# Patient Record
Sex: Male | Born: 1970
Health system: Southern US, Community
[De-identification: ages and names within clinical notes are randomized; demographics above are authoritative.]

---

## 2000-09-29 ENCOUNTER — Encounter: Admission: RE | Admit: 2000-09-29 | Discharge: 2000-09-29 | Payer: Self-pay | Admitting: Family Medicine

## 2000-10-08 ENCOUNTER — Encounter: Admission: RE | Admit: 2000-10-08 | Discharge: 2000-10-08 | Payer: Self-pay | Admitting: Family Medicine

## 2000-10-11 ENCOUNTER — Encounter: Admission: RE | Admit: 2000-10-11 | Discharge: 2001-01-09 | Payer: Self-pay | Admitting: *Deleted

## 2000-10-27 ENCOUNTER — Encounter: Admission: RE | Admit: 2000-10-27 | Discharge: 2000-10-27 | Payer: Self-pay | Admitting: Family Medicine

## 2001-03-10 ENCOUNTER — Encounter: Admission: RE | Admit: 2001-03-10 | Discharge: 2001-03-10 | Payer: Self-pay | Admitting: Family Medicine

## 2001-03-31 ENCOUNTER — Encounter: Admission: RE | Admit: 2001-03-31 | Discharge: 2001-03-31 | Payer: Self-pay | Admitting: Family Medicine

## 2001-04-11 ENCOUNTER — Encounter: Admission: RE | Admit: 2001-04-11 | Discharge: 2001-04-11 | Payer: Self-pay | Admitting: Family Medicine

## 2001-06-03 ENCOUNTER — Encounter: Admission: RE | Admit: 2001-06-03 | Discharge: 2001-06-03 | Payer: Self-pay | Admitting: Family Medicine

## 2001-06-14 ENCOUNTER — Encounter: Admission: RE | Admit: 2001-06-14 | Discharge: 2001-06-14 | Payer: Self-pay | Admitting: Sports Medicine

## 2001-09-22 ENCOUNTER — Encounter: Admission: RE | Admit: 2001-09-22 | Discharge: 2001-09-22 | Payer: Self-pay | Admitting: Family Medicine

## 2001-10-18 ENCOUNTER — Encounter: Admission: RE | Admit: 2001-10-18 | Discharge: 2001-10-18 | Payer: Self-pay | Admitting: Sports Medicine

## 2001-12-05 ENCOUNTER — Encounter: Admission: RE | Admit: 2001-12-05 | Discharge: 2001-12-05 | Payer: Self-pay | Admitting: Family Medicine

## 2001-12-07 ENCOUNTER — Encounter: Admission: RE | Admit: 2001-12-07 | Discharge: 2001-12-07 | Payer: Self-pay | Admitting: Sports Medicine

## 2001-12-07 ENCOUNTER — Encounter: Payer: Self-pay | Admitting: Sports Medicine

## 2001-12-26 ENCOUNTER — Encounter: Admission: RE | Admit: 2001-12-26 | Discharge: 2001-12-26 | Payer: Self-pay | Admitting: Family Medicine

## 2002-06-28 ENCOUNTER — Encounter: Admission: RE | Admit: 2002-06-28 | Discharge: 2002-06-28 | Payer: Self-pay | Admitting: Family Medicine

## 2002-12-01 ENCOUNTER — Encounter: Admission: RE | Admit: 2002-12-01 | Discharge: 2002-12-01 | Payer: Self-pay | Admitting: Family Medicine

## 2002-12-02 ENCOUNTER — Emergency Department (HOSPITAL_COMMUNITY): Admission: EM | Admit: 2002-12-02 | Discharge: 2002-12-02 | Payer: Self-pay | Admitting: Emergency Medicine

## 2002-12-07 ENCOUNTER — Encounter: Admission: RE | Admit: 2002-12-07 | Discharge: 2002-12-07 | Payer: Self-pay | Admitting: Family Medicine

## 2002-12-26 ENCOUNTER — Ambulatory Visit (HOSPITAL_COMMUNITY): Admission: RE | Admit: 2002-12-26 | Discharge: 2002-12-26 | Payer: Self-pay | Admitting: Sports Medicine

## 2003-01-02 ENCOUNTER — Encounter: Admission: RE | Admit: 2003-01-02 | Discharge: 2003-01-02 | Payer: Self-pay | Admitting: Family Medicine

## 2003-01-15 ENCOUNTER — Encounter: Payer: Self-pay | Admitting: Emergency Medicine

## 2003-01-15 ENCOUNTER — Emergency Department (HOSPITAL_COMMUNITY): Admission: EM | Admit: 2003-01-15 | Discharge: 2003-01-15 | Payer: Self-pay | Admitting: Emergency Medicine

## 2003-03-09 ENCOUNTER — Encounter: Admission: RE | Admit: 2003-03-09 | Discharge: 2003-03-09 | Payer: Self-pay | Admitting: Family Medicine

## 2003-03-12 ENCOUNTER — Encounter: Admission: RE | Admit: 2003-03-12 | Discharge: 2003-03-12 | Payer: Self-pay | Admitting: Family Medicine

## 2003-04-02 ENCOUNTER — Encounter: Admission: RE | Admit: 2003-04-02 | Discharge: 2003-04-02 | Payer: Self-pay | Admitting: Family Medicine

## 2003-07-20 ENCOUNTER — Encounter: Admission: RE | Admit: 2003-07-20 | Discharge: 2003-07-20 | Payer: Self-pay | Admitting: Family Medicine

## 2004-01-14 ENCOUNTER — Encounter: Admission: RE | Admit: 2004-01-14 | Discharge: 2004-01-14 | Payer: Self-pay | Admitting: Family Medicine

## 2004-04-08 ENCOUNTER — Ambulatory Visit: Payer: Self-pay | Admitting: Family Medicine

## 2006-07-22 DIAGNOSIS — R809 Proteinuria, unspecified: Secondary | ICD-10-CM | POA: Insufficient documentation

## 2006-07-22 DIAGNOSIS — J45909 Unspecified asthma, uncomplicated: Secondary | ICD-10-CM | POA: Insufficient documentation

## 2006-07-22 DIAGNOSIS — E119 Type 2 diabetes mellitus without complications: Secondary | ICD-10-CM | POA: Insufficient documentation

## 2006-07-22 DIAGNOSIS — R81 Glycosuria: Secondary | ICD-10-CM | POA: Insufficient documentation

## 2006-07-22 DIAGNOSIS — E669 Obesity, unspecified: Secondary | ICD-10-CM | POA: Insufficient documentation

## 2006-07-22 DIAGNOSIS — I10 Essential (primary) hypertension: Secondary | ICD-10-CM | POA: Insufficient documentation

## 2006-07-22 DIAGNOSIS — R000 Tachycardia, unspecified: Secondary | ICD-10-CM | POA: Insufficient documentation

## 2006-07-22 DIAGNOSIS — K219 Gastro-esophageal reflux disease without esophagitis: Secondary | ICD-10-CM | POA: Insufficient documentation

## 2006-08-05 ENCOUNTER — Emergency Department (HOSPITAL_COMMUNITY): Admission: EM | Admit: 2006-08-05 | Discharge: 2006-08-05 | Payer: Self-pay | Admitting: Emergency Medicine

## 2006-08-27 ENCOUNTER — Ambulatory Visit: Payer: Self-pay | Admitting: Psychiatry

## 2006-08-27 ENCOUNTER — Inpatient Hospital Stay (HOSPITAL_COMMUNITY): Admission: AD | Admit: 2006-08-27 | Discharge: 2006-09-03 | Payer: Self-pay | Admitting: Psychiatry

## 2009-08-06 ENCOUNTER — Emergency Department (HOSPITAL_COMMUNITY): Admission: EM | Admit: 2009-08-06 | Discharge: 2009-08-06 | Payer: Self-pay | Admitting: Emergency Medicine

## 2010-08-18 LAB — DIFFERENTIAL
Basophils Absolute: 0.1 10*3/uL (ref 0.0–0.1)
Basophils Relative: 1 % (ref 0–1)
Eosinophils Absolute: 0.2 10*3/uL (ref 0.0–0.7)
Eosinophils Relative: 2 % (ref 0–5)
Lymphocytes Relative: 29 % (ref 12–46)
Lymphs Abs: 2.9 10*3/uL (ref 0.7–4.0)
Monocytes Absolute: 0.6 10*3/uL (ref 0.1–1.0)
Monocytes Relative: 6 % (ref 3–12)
Neutro Abs: 6.3 10*3/uL (ref 1.7–7.7)
Neutrophils Relative %: 63 % (ref 43–77)

## 2010-08-18 LAB — CBC
HCT: 48.5 % (ref 39.0–52.0)
Hemoglobin: 16.1 g/dL (ref 13.0–17.0)
MCHC: 33.1 g/dL (ref 30.0–36.0)
MCV: 90.8 fL (ref 78.0–100.0)
Platelets: 279 10*3/uL (ref 150–400)
RBC: 5.34 MIL/uL (ref 4.22–5.81)
RDW: 12.6 % (ref 11.5–15.5)
WBC: 10.1 10*3/uL (ref 4.0–10.5)

## 2010-08-18 LAB — BASIC METABOLIC PANEL
BUN: 7 mg/dL (ref 6–23)
CO2: 21 mEq/L (ref 19–32)
Calcium: 9.6 mg/dL (ref 8.4–10.5)
Chloride: 104 mEq/L (ref 96–112)
Creatinine, Ser: 0.79 mg/dL (ref 0.4–1.5)
GFR calc Af Amer: >60 mL/min (ref >60)
GFR calc non Af Amer: >60 mL/min (ref >60)
Glucose, Bld: 192 mg/dL — ABNORMAL HIGH (ref 70–99)
Potassium: 4.6 mEq/L (ref 3.5–5.1)
Sodium: 135 mEq/L (ref 135–145)

## 2010-08-18 LAB — URINALYSIS, ROUTINE W REFLEX MICROSCOPIC
Glucose, UA: NEGATIVE mg/dL
Hgb urine dipstick: NEGATIVE
Ketones, ur: NEGATIVE mg/dL
Protein, ur: NEGATIVE mg/dL

## 2010-08-18 LAB — POCT CARDIAC MARKERS: Troponin i, poc: <0.05 ng/mL (ref 0.00–0.09)
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
## 2015-12-18 DIAGNOSIS — Z0000 Encounter for general adult medical examination without abnormal findings: Secondary | ICD-10-CM | POA: Diagnosis not present

## 2015-12-18 DIAGNOSIS — E1165 Type 2 diabetes mellitus with hyperglycemia: Secondary | ICD-10-CM | POA: Diagnosis not present

## 2015-12-18 DIAGNOSIS — Z6841 Body Mass Index (BMI) 40.0 and over, adult: Secondary | ICD-10-CM | POA: Diagnosis not present

## 2015-12-18 DIAGNOSIS — I10 Essential (primary) hypertension: Secondary | ICD-10-CM | POA: Diagnosis not present

## 2015-12-18 DIAGNOSIS — E781 Pure hyperglyceridemia: Secondary | ICD-10-CM | POA: Diagnosis not present

## 2016-03-20 DIAGNOSIS — I10 Essential (primary) hypertension: Secondary | ICD-10-CM | POA: Diagnosis not present

## 2016-03-20 DIAGNOSIS — E781 Pure hyperglyceridemia: Secondary | ICD-10-CM | POA: Diagnosis not present

## 2016-03-20 DIAGNOSIS — E1165 Type 2 diabetes mellitus with hyperglycemia: Secondary | ICD-10-CM | POA: Diagnosis not present

## 2016-03-20 DIAGNOSIS — Z6841 Body Mass Index (BMI) 40.0 and over, adult: Secondary | ICD-10-CM | POA: Diagnosis not present

## 2016-06-18 DIAGNOSIS — E781 Pure hyperglyceridemia: Secondary | ICD-10-CM | POA: Diagnosis not present

## 2016-06-18 DIAGNOSIS — E1165 Type 2 diabetes mellitus with hyperglycemia: Secondary | ICD-10-CM | POA: Diagnosis not present

## 2016-06-22 DIAGNOSIS — Z6841 Body Mass Index (BMI) 40.0 and over, adult: Secondary | ICD-10-CM | POA: Diagnosis not present

## 2016-06-22 DIAGNOSIS — I10 Essential (primary) hypertension: Secondary | ICD-10-CM | POA: Diagnosis not present

## 2016-06-22 DIAGNOSIS — E781 Pure hyperglyceridemia: Secondary | ICD-10-CM | POA: Diagnosis not present

## 2016-06-22 DIAGNOSIS — E1165 Type 2 diabetes mellitus with hyperglycemia: Secondary | ICD-10-CM | POA: Diagnosis not present

## 2017-01-05 DIAGNOSIS — I10 Essential (primary) hypertension: Secondary | ICD-10-CM | POA: Diagnosis not present

## 2017-01-05 DIAGNOSIS — E781 Pure hyperglyceridemia: Secondary | ICD-10-CM | POA: Diagnosis not present

## 2017-01-05 DIAGNOSIS — E1165 Type 2 diabetes mellitus with hyperglycemia: Secondary | ICD-10-CM | POA: Diagnosis not present

## 2017-01-05 DIAGNOSIS — B372 Candidiasis of skin and nail: Secondary | ICD-10-CM | POA: Diagnosis not present

## 2017-02-08 DIAGNOSIS — E119 Type 2 diabetes mellitus without complications: Secondary | ICD-10-CM | POA: Diagnosis not present

## 2017-02-08 DIAGNOSIS — I10 Essential (primary) hypertension: Secondary | ICD-10-CM | POA: Diagnosis not present

## 2017-03-31 DIAGNOSIS — R0602 Shortness of breath: Secondary | ICD-10-CM | POA: Diagnosis not present

## 2017-03-31 DIAGNOSIS — I10 Essential (primary) hypertension: Secondary | ICD-10-CM | POA: Diagnosis not present

## 2017-03-31 DIAGNOSIS — E119 Type 2 diabetes mellitus without complications: Secondary | ICD-10-CM | POA: Diagnosis not present

## 2017-03-31 DIAGNOSIS — Z713 Dietary counseling and surveillance: Secondary | ICD-10-CM | POA: Diagnosis not present

## 2017-04-06 DIAGNOSIS — E781 Pure hyperglyceridemia: Secondary | ICD-10-CM | POA: Diagnosis not present

## 2017-04-06 DIAGNOSIS — E1165 Type 2 diabetes mellitus with hyperglycemia: Secondary | ICD-10-CM | POA: Diagnosis not present

## 2017-04-09 DIAGNOSIS — Z1331 Encounter for screening for depression: Secondary | ICD-10-CM | POA: Diagnosis not present

## 2017-04-09 DIAGNOSIS — E781 Pure hyperglyceridemia: Secondary | ICD-10-CM | POA: Diagnosis not present

## 2017-04-09 DIAGNOSIS — I10 Essential (primary) hypertension: Secondary | ICD-10-CM | POA: Diagnosis not present

## 2017-04-09 DIAGNOSIS — E1165 Type 2 diabetes mellitus with hyperglycemia: Secondary | ICD-10-CM | POA: Diagnosis not present

## 2017-05-17 DIAGNOSIS — J101 Influenza due to other identified influenza virus with other respiratory manifestations: Secondary | ICD-10-CM | POA: Diagnosis not present

## 2017-05-17 DIAGNOSIS — Z6841 Body Mass Index (BMI) 40.0 and over, adult: Secondary | ICD-10-CM | POA: Diagnosis not present

## 2017-05-17 DIAGNOSIS — J09X1 Influenza due to identified novel influenza A virus with pneumonia: Secondary | ICD-10-CM | POA: Diagnosis not present

## 2017-06-02 DIAGNOSIS — J4521 Mild intermittent asthma with (acute) exacerbation: Secondary | ICD-10-CM | POA: Diagnosis not present

## 2017-06-02 DIAGNOSIS — Z6841 Body Mass Index (BMI) 40.0 and over, adult: Secondary | ICD-10-CM | POA: Diagnosis not present

## 2017-06-02 DIAGNOSIS — I10 Essential (primary) hypertension: Secondary | ICD-10-CM | POA: Diagnosis not present

## 2017-07-12 DIAGNOSIS — E781 Pure hyperglyceridemia: Secondary | ICD-10-CM | POA: Diagnosis not present

## 2017-07-12 DIAGNOSIS — E1165 Type 2 diabetes mellitus with hyperglycemia: Secondary | ICD-10-CM | POA: Diagnosis not present

## 2017-07-14 DIAGNOSIS — Z6841 Body Mass Index (BMI) 40.0 and over, adult: Secondary | ICD-10-CM | POA: Diagnosis not present

## 2017-07-14 DIAGNOSIS — E781 Pure hyperglyceridemia: Secondary | ICD-10-CM | POA: Diagnosis not present

## 2017-07-14 DIAGNOSIS — I10 Essential (primary) hypertension: Secondary | ICD-10-CM | POA: Diagnosis not present

## 2017-07-14 DIAGNOSIS — E1165 Type 2 diabetes mellitus with hyperglycemia: Secondary | ICD-10-CM | POA: Diagnosis not present

## 2017-09-15 DIAGNOSIS — E1165 Type 2 diabetes mellitus with hyperglycemia: Secondary | ICD-10-CM | POA: Diagnosis not present

## 2017-09-15 DIAGNOSIS — I10 Essential (primary) hypertension: Secondary | ICD-10-CM | POA: Diagnosis not present

## 2017-09-15 DIAGNOSIS — E781 Pure hyperglyceridemia: Secondary | ICD-10-CM | POA: Diagnosis not present

## 2017-09-15 DIAGNOSIS — E1140 Type 2 diabetes mellitus with diabetic neuropathy, unspecified: Secondary | ICD-10-CM | POA: Diagnosis not present

## 2017-09-16 DIAGNOSIS — G4733 Obstructive sleep apnea (adult) (pediatric): Secondary | ICD-10-CM | POA: Diagnosis not present

## 2017-11-18 DIAGNOSIS — E1165 Type 2 diabetes mellitus with hyperglycemia: Secondary | ICD-10-CM | POA: Diagnosis not present

## 2017-11-18 DIAGNOSIS — E781 Pure hyperglyceridemia: Secondary | ICD-10-CM | POA: Diagnosis not present

## 2017-11-18 DIAGNOSIS — I10 Essential (primary) hypertension: Secondary | ICD-10-CM | POA: Diagnosis not present

## 2017-11-18 DIAGNOSIS — E1140 Type 2 diabetes mellitus with diabetic neuropathy, unspecified: Secondary | ICD-10-CM | POA: Diagnosis not present

## 2017-12-08 DIAGNOSIS — Z6841 Body Mass Index (BMI) 40.0 and over, adult: Secondary | ICD-10-CM | POA: Diagnosis not present

## 2017-12-08 DIAGNOSIS — I10 Essential (primary) hypertension: Secondary | ICD-10-CM | POA: Diagnosis not present

## 2017-12-08 DIAGNOSIS — E119 Type 2 diabetes mellitus without complications: Secondary | ICD-10-CM | POA: Diagnosis not present

## 2017-12-30 DIAGNOSIS — Z79899 Other long term (current) drug therapy: Secondary | ICD-10-CM | POA: Diagnosis not present

## 2017-12-30 DIAGNOSIS — K219 Gastro-esophageal reflux disease without esophagitis: Secondary | ICD-10-CM | POA: Diagnosis not present

## 2017-12-30 DIAGNOSIS — K9289 Other specified diseases of the digestive system: Secondary | ICD-10-CM | POA: Diagnosis not present

## 2017-12-30 DIAGNOSIS — I10 Essential (primary) hypertension: Secondary | ICD-10-CM | POA: Diagnosis not present

## 2017-12-30 DIAGNOSIS — Z6841 Body Mass Index (BMI) 40.0 and over, adult: Secondary | ICD-10-CM | POA: Diagnosis not present

## 2017-12-30 DIAGNOSIS — E119 Type 2 diabetes mellitus without complications: Secondary | ICD-10-CM | POA: Diagnosis not present

## 2017-12-30 DIAGNOSIS — Z01818 Encounter for other preprocedural examination: Secondary | ICD-10-CM | POA: Diagnosis not present

## 2017-12-30 DIAGNOSIS — L539 Erythematous condition, unspecified: Secondary | ICD-10-CM | POA: Diagnosis not present

## 2017-12-30 DIAGNOSIS — K2970 Gastritis, unspecified, without bleeding: Secondary | ICD-10-CM | POA: Diagnosis not present

## 2017-12-30 DIAGNOSIS — Z7984 Long term (current) use of oral hypoglycemic drugs: Secondary | ICD-10-CM | POA: Diagnosis not present

## 2018-01-05 DIAGNOSIS — I10 Essential (primary) hypertension: Secondary | ICD-10-CM | POA: Diagnosis not present

## 2018-01-05 DIAGNOSIS — E119 Type 2 diabetes mellitus without complications: Secondary | ICD-10-CM | POA: Diagnosis not present

## 2018-01-05 DIAGNOSIS — K219 Gastro-esophageal reflux disease without esophagitis: Secondary | ICD-10-CM | POA: Diagnosis not present

## 2018-01-05 DIAGNOSIS — Z6841 Body Mass Index (BMI) 40.0 and over, adult: Secondary | ICD-10-CM | POA: Diagnosis not present

## 2018-01-10 ENCOUNTER — Other Ambulatory Visit: Payer: Self-pay | Admitting: Specialist

## 2018-01-10 DIAGNOSIS — K9289 Other specified diseases of the digestive system: Secondary | ICD-10-CM

## 2018-01-15 ENCOUNTER — Encounter
Admission: RE | Admit: 2018-01-15 | Discharge: 2018-01-15 | Disposition: A | Discharge status: Home / Self Care | Payer: BLUE CROSS/BLUE SHIELD | Source: Ambulatory Visit | Attending: Specialist | Admitting: Specialist

## 2018-01-15 DIAGNOSIS — K9289 Other specified diseases of the digestive system: Secondary | ICD-10-CM | POA: Insufficient documentation

## 2018-01-15 DIAGNOSIS — R140 Abdominal distension (gaseous): Secondary | ICD-10-CM | POA: Diagnosis not present

## 2018-01-15 MED ORDER — TECHNETIUM TC 99M SULFUR COLLOID
2.0000 | Freq: Once | INTRAVENOUS | Status: AC | PRN
  Administered 2018-01-15: 2.33 via ORAL

## 2018-02-02 DIAGNOSIS — K219 Gastro-esophageal reflux disease without esophagitis: Secondary | ICD-10-CM | POA: Diagnosis not present

## 2018-02-02 DIAGNOSIS — K824 Cholesterolosis of gallbladder: Secondary | ICD-10-CM | POA: Diagnosis not present

## 2018-02-02 DIAGNOSIS — I10 Essential (primary) hypertension: Secondary | ICD-10-CM | POA: Diagnosis not present

## 2018-02-10 DIAGNOSIS — Z01818 Encounter for other preprocedural examination: Secondary | ICD-10-CM | POA: Diagnosis not present

## 2018-02-24 DIAGNOSIS — E559 Vitamin D deficiency, unspecified: Secondary | ICD-10-CM | POA: Diagnosis not present

## 2018-02-24 DIAGNOSIS — E119 Type 2 diabetes mellitus without complications: Secondary | ICD-10-CM | POA: Diagnosis not present

## 2018-02-24 DIAGNOSIS — J45909 Unspecified asthma, uncomplicated: Secondary | ICD-10-CM | POA: Diagnosis not present

## 2018-02-24 DIAGNOSIS — K824 Cholesterolosis of gallbladder: Secondary | ICD-10-CM | POA: Diagnosis not present

## 2018-02-24 DIAGNOSIS — K219 Gastro-esophageal reflux disease without esophagitis: Secondary | ICD-10-CM | POA: Diagnosis not present

## 2018-02-24 DIAGNOSIS — Z79899 Other long term (current) drug therapy: Secondary | ICD-10-CM | POA: Diagnosis not present

## 2018-02-24 DIAGNOSIS — E1140 Type 2 diabetes mellitus with diabetic neuropathy, unspecified: Secondary | ICD-10-CM | POA: Diagnosis not present

## 2018-02-24 DIAGNOSIS — K811 Chronic cholecystitis: Secondary | ICD-10-CM | POA: Diagnosis not present

## 2018-02-24 DIAGNOSIS — Z6841 Body Mass Index (BMI) 40.0 and over, adult: Secondary | ICD-10-CM | POA: Diagnosis not present

## 2018-02-24 DIAGNOSIS — Z7984 Long term (current) use of oral hypoglycemic drugs: Secondary | ICD-10-CM | POA: Diagnosis not present

## 2018-02-24 DIAGNOSIS — G4733 Obstructive sleep apnea (adult) (pediatric): Secondary | ICD-10-CM | POA: Diagnosis not present

## 2018-02-24 DIAGNOSIS — I10 Essential (primary) hypertension: Secondary | ICD-10-CM | POA: Diagnosis not present

## 2018-02-24 DIAGNOSIS — Z6839 Body mass index (BMI) 39.0-39.9, adult: Secondary | ICD-10-CM | POA: Diagnosis not present

## 2018-03-01 DIAGNOSIS — E11319 Type 2 diabetes mellitus with unspecified diabetic retinopathy without macular edema: Secondary | ICD-10-CM | POA: Diagnosis not present

## 2018-03-15 DIAGNOSIS — Z713 Dietary counseling and surveillance: Secondary | ICD-10-CM | POA: Diagnosis not present

## 2018-03-15 DIAGNOSIS — Z9884 Bariatric surgery status: Secondary | ICD-10-CM | POA: Diagnosis not present

## 2018-06-02 DIAGNOSIS — E1140 Type 2 diabetes mellitus with diabetic neuropathy, unspecified: Secondary | ICD-10-CM | POA: Diagnosis not present

## 2018-06-02 DIAGNOSIS — E1165 Type 2 diabetes mellitus with hyperglycemia: Secondary | ICD-10-CM | POA: Diagnosis not present

## 2018-06-02 DIAGNOSIS — E781 Pure hyperglyceridemia: Secondary | ICD-10-CM | POA: Diagnosis not present

## 2018-06-02 DIAGNOSIS — I10 Essential (primary) hypertension: Secondary | ICD-10-CM | POA: Diagnosis not present

## 2018-12-01 DIAGNOSIS — Z9884 Bariatric surgery status: Secondary | ICD-10-CM | POA: Diagnosis not present

## 2018-12-01 DIAGNOSIS — E1140 Type 2 diabetes mellitus with diabetic neuropathy, unspecified: Secondary | ICD-10-CM | POA: Diagnosis not present

## 2018-12-01 DIAGNOSIS — Z6834 Body mass index (BMI) 34.0-34.9, adult: Secondary | ICD-10-CM | POA: Diagnosis not present

## 2018-12-01 DIAGNOSIS — E781 Pure hyperglyceridemia: Secondary | ICD-10-CM | POA: Diagnosis not present

## 2018-12-01 DIAGNOSIS — Z1331 Encounter for screening for depression: Secondary | ICD-10-CM | POA: Diagnosis not present

## 2019-01-17 DIAGNOSIS — Z6830 Body mass index (BMI) 30.0-30.9, adult: Secondary | ICD-10-CM | POA: Diagnosis not present

## 2019-01-17 DIAGNOSIS — M545 Low back pain: Secondary | ICD-10-CM | POA: Diagnosis not present

## 2019-04-05 DIAGNOSIS — E669 Obesity, unspecified: Secondary | ICD-10-CM | POA: Diagnosis not present

## 2019-04-05 DIAGNOSIS — M545 Low back pain: Secondary | ICD-10-CM | POA: Diagnosis not present

## 2019-04-05 DIAGNOSIS — Z6830 Body mass index (BMI) 30.0-30.9, adult: Secondary | ICD-10-CM | POA: Diagnosis not present

## 2019-05-03 DIAGNOSIS — Q7649 Other congenital malformations of spine, not associated with scoliosis: Secondary | ICD-10-CM | POA: Diagnosis not present

## 2019-05-03 DIAGNOSIS — M545 Low back pain: Secondary | ICD-10-CM | POA: Diagnosis not present

## 2019-05-03 DIAGNOSIS — M9904 Segmental and somatic dysfunction of sacral region: Secondary | ICD-10-CM | POA: Diagnosis not present

## 2019-05-03 DIAGNOSIS — M9903 Segmental and somatic dysfunction of lumbar region: Secondary | ICD-10-CM | POA: Diagnosis not present

## 2019-05-10 DIAGNOSIS — M545 Low back pain: Secondary | ICD-10-CM | POA: Diagnosis not present

## 2019-05-10 DIAGNOSIS — M9904 Segmental and somatic dysfunction of sacral region: Secondary | ICD-10-CM | POA: Diagnosis not present

## 2019-05-10 DIAGNOSIS — M9903 Segmental and somatic dysfunction of lumbar region: Secondary | ICD-10-CM | POA: Diagnosis not present

## 2019-05-10 DIAGNOSIS — Q7649 Other congenital malformations of spine, not associated with scoliosis: Secondary | ICD-10-CM | POA: Diagnosis not present

## 2019-05-15 DIAGNOSIS — M545 Low back pain: Secondary | ICD-10-CM | POA: Diagnosis not present

## 2019-05-15 DIAGNOSIS — M9903 Segmental and somatic dysfunction of lumbar region: Secondary | ICD-10-CM | POA: Diagnosis not present

## 2019-05-15 DIAGNOSIS — M9904 Segmental and somatic dysfunction of sacral region: Secondary | ICD-10-CM | POA: Diagnosis not present

## 2019-05-15 DIAGNOSIS — Q7649 Other congenital malformations of spine, not associated with scoliosis: Secondary | ICD-10-CM | POA: Diagnosis not present

## 2019-05-22 DIAGNOSIS — Z20828 Contact with and (suspected) exposure to other viral communicable diseases: Secondary | ICD-10-CM | POA: Diagnosis not present

## 2019-06-12 DIAGNOSIS — M9903 Segmental and somatic dysfunction of lumbar region: Secondary | ICD-10-CM | POA: Diagnosis not present

## 2019-06-12 DIAGNOSIS — M545 Low back pain: Secondary | ICD-10-CM | POA: Diagnosis not present

## 2019-06-12 DIAGNOSIS — M9904 Segmental and somatic dysfunction of sacral region: Secondary | ICD-10-CM | POA: Diagnosis not present

## 2019-06-12 DIAGNOSIS — Q7649 Other congenital malformations of spine, not associated with scoliosis: Secondary | ICD-10-CM | POA: Diagnosis not present

## 2019-06-19 DIAGNOSIS — Q7649 Other congenital malformations of spine, not associated with scoliosis: Secondary | ICD-10-CM | POA: Diagnosis not present

## 2019-06-19 DIAGNOSIS — M545 Low back pain: Secondary | ICD-10-CM | POA: Diagnosis not present

## 2019-06-19 DIAGNOSIS — M9904 Segmental and somatic dysfunction of sacral region: Secondary | ICD-10-CM | POA: Diagnosis not present

## 2019-06-19 DIAGNOSIS — M9903 Segmental and somatic dysfunction of lumbar region: Secondary | ICD-10-CM | POA: Diagnosis not present

## 2019-06-26 DIAGNOSIS — M9903 Segmental and somatic dysfunction of lumbar region: Secondary | ICD-10-CM | POA: Diagnosis not present

## 2019-06-26 DIAGNOSIS — Q7649 Other congenital malformations of spine, not associated with scoliosis: Secondary | ICD-10-CM | POA: Diagnosis not present

## 2019-06-26 DIAGNOSIS — M545 Low back pain: Secondary | ICD-10-CM | POA: Diagnosis not present

## 2019-06-26 DIAGNOSIS — M9904 Segmental and somatic dysfunction of sacral region: Secondary | ICD-10-CM | POA: Diagnosis not present

## 2019-07-07 DIAGNOSIS — M545 Low back pain: Secondary | ICD-10-CM | POA: Diagnosis not present

## 2019-07-07 DIAGNOSIS — E1140 Type 2 diabetes mellitus with diabetic neuropathy, unspecified: Secondary | ICD-10-CM | POA: Diagnosis not present

## 2019-07-20 DIAGNOSIS — Q7649 Other congenital malformations of spine, not associated with scoliosis: Secondary | ICD-10-CM | POA: Diagnosis not present

## 2019-07-20 DIAGNOSIS — M545 Low back pain: Secondary | ICD-10-CM | POA: Diagnosis not present

## 2019-07-20 DIAGNOSIS — M9904 Segmental and somatic dysfunction of sacral region: Secondary | ICD-10-CM | POA: Diagnosis not present

## 2019-07-20 DIAGNOSIS — M9903 Segmental and somatic dysfunction of lumbar region: Secondary | ICD-10-CM | POA: Diagnosis not present

## 2019-08-03 DIAGNOSIS — M9903 Segmental and somatic dysfunction of lumbar region: Secondary | ICD-10-CM | POA: Diagnosis not present

## 2019-08-03 DIAGNOSIS — M545 Low back pain: Secondary | ICD-10-CM | POA: Diagnosis not present

## 2019-08-03 DIAGNOSIS — M9904 Segmental and somatic dysfunction of sacral region: Secondary | ICD-10-CM | POA: Diagnosis not present

## 2019-08-03 DIAGNOSIS — Q7649 Other congenital malformations of spine, not associated with scoliosis: Secondary | ICD-10-CM | POA: Diagnosis not present

## 2019-12-16 ENCOUNTER — Emergency Department (HOSPITAL_COMMUNITY)
Admission: EM | Admit: 2019-12-16 | Discharge: 2019-12-16 | Disposition: A | Discharge status: Home / Self Care | Payer: BLUE CROSS/BLUE SHIELD

## 2019-12-16 DIAGNOSIS — R52 Pain, unspecified: Secondary | ICD-10-CM | POA: Diagnosis not present

## 2019-12-16 DIAGNOSIS — R1084 Generalized abdominal pain: Secondary | ICD-10-CM | POA: Diagnosis not present

## 2019-12-16 DIAGNOSIS — I10 Essential (primary) hypertension: Secondary | ICD-10-CM | POA: Diagnosis not present

## 2019-12-16 DIAGNOSIS — R000 Tachycardia, unspecified: Secondary | ICD-10-CM | POA: Diagnosis not present

## 2019-12-16 NOTE — ED Notes (Signed)
Patient left triage after arrival , stated" I'm leaving".

## 2019-12-27 DIAGNOSIS — M793 Panniculitis, unspecified: Secondary | ICD-10-CM | POA: Diagnosis not present

## 2019-12-27 DIAGNOSIS — Z9884 Bariatric surgery status: Secondary | ICD-10-CM | POA: Diagnosis not present

## 2020-02-07 DIAGNOSIS — M545 Low back pain: Secondary | ICD-10-CM | POA: Diagnosis not present

## 2020-02-07 DIAGNOSIS — E781 Pure hyperglyceridemia: Secondary | ICD-10-CM | POA: Diagnosis not present

## 2020-02-07 DIAGNOSIS — Z9884 Bariatric surgery status: Secondary | ICD-10-CM | POA: Diagnosis not present

## 2020-02-07 DIAGNOSIS — E1140 Type 2 diabetes mellitus with diabetic neuropathy, unspecified: Secondary | ICD-10-CM | POA: Diagnosis not present

## 2020-08-08 DIAGNOSIS — Z1331 Encounter for screening for depression: Secondary | ICD-10-CM | POA: Diagnosis not present

## 2020-08-08 DIAGNOSIS — Z9884 Bariatric surgery status: Secondary | ICD-10-CM | POA: Diagnosis not present

## 2020-08-08 DIAGNOSIS — E1140 Type 2 diabetes mellitus with diabetic neuropathy, unspecified: Secondary | ICD-10-CM | POA: Diagnosis not present

## 2020-08-08 DIAGNOSIS — E781 Pure hyperglyceridemia: Secondary | ICD-10-CM | POA: Diagnosis not present

## 2020-08-16 IMAGING — NM NM GASTRIC EMPTYING
6 series · 20 of 20 positions shown · non-contrast
Comparison: None.

CLINICAL DATA: Bloating/gas for 2 years.  Pain.

EXAM:
NUCLEAR MEDICINE GASTRIC EMPTYING SCAN
TECHNIQUE: After oral ingestion of radiolabeled meal, sequential abdominal
images were obtained for 4 hours. Percentage of activity emptying
the stomach was calculated at 1 hour, 2 hour, 3 hour, and 4 hours.
RADIOPHARMACEUTICALS:  2.33 mCi 6c-66m sulfur colloid in
standardized meal

[Series 1000: gastric statics · 3.90mm/px · 2 of 2 frames shown (1 of 3)]
[frame 1/2]
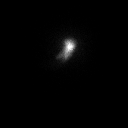
[frame 2/2]
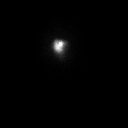

[Series 1000: gatric statics · 3.90mm/px · 2 of 2 frames shown (1 of 2)]
[frame 1/2]
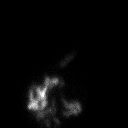
[frame 2/2]
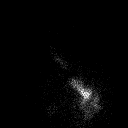

[Series 1000: gastric statics · 3.90mm/px · 2 of 2 frames shown (2 of 3)]
[frame 1/2]
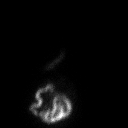
[frame 2/2]
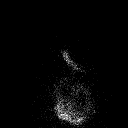

[Series 1000: gatric statics (results) · 3.90mm/px · 5 acquisitions, 10 frames shown]
[im 1/5]
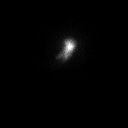
[im 1/5]
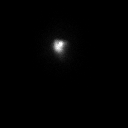
[im 2/5]
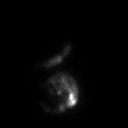
[im 2/5]
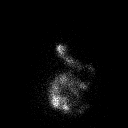
[im 3/5]
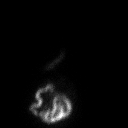
[im 3/5]
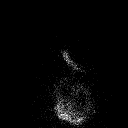
[im 4/5]
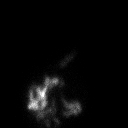
[im 4/5]
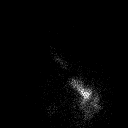
[im 5/5]
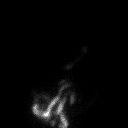
[im 5/5]
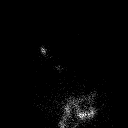

[Series 1000: gatric statics · 3.90mm/px · 2 of 2 frames shown (2 of 2)]
[frame 1/2]
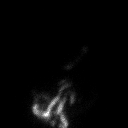
[frame 2/2]
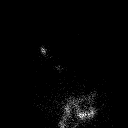

[Series 1000: gastric statics · 3.90mm/px · 2 of 2 frames shown (3 of 3)]
[frame 1/2]
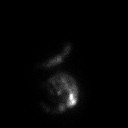
[frame 2/2]
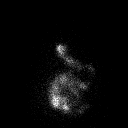

[20 of 20 positions shown; findings below may reference images not displayed]

FINDINGS: Expected location of the stomach in the left upper quadrant.
Ingested meal empties the stomach quickly with most of the emptying
occurring over the first hour and in gradual additional impeding
over the remaining 3 hours.

84% emptied at 1 hr ( normal >= 10%)

95% emptied at 2 hr ( normal >= 40%)

95% emptied at 3 hr ( normal >= 70%)

97% emptied at 4 hr ( normal >= 90%)
IMPRESSION: Normal gastric emptying study.

## 2020-10-24 DIAGNOSIS — S53439A Radial collateral ligament sprain of unspecified elbow, initial encounter: Secondary | ICD-10-CM | POA: Diagnosis not present

## 2020-10-24 DIAGNOSIS — Z6828 Body mass index (BMI) 28.0-28.9, adult: Secondary | ICD-10-CM | POA: Diagnosis not present

## 2020-12-05 DIAGNOSIS — Z6828 Body mass index (BMI) 28.0-28.9, adult: Secondary | ICD-10-CM | POA: Diagnosis not present

## 2020-12-05 DIAGNOSIS — L509 Urticaria, unspecified: Secondary | ICD-10-CM | POA: Diagnosis not present

## 2021-02-11 DIAGNOSIS — E781 Pure hyperglyceridemia: Secondary | ICD-10-CM | POA: Diagnosis not present

## 2021-02-11 DIAGNOSIS — E1142 Type 2 diabetes mellitus with diabetic polyneuropathy: Secondary | ICD-10-CM | POA: Diagnosis not present

## 2021-02-11 DIAGNOSIS — Z9884 Bariatric surgery status: Secondary | ICD-10-CM | POA: Diagnosis not present

## 2021-02-11 DIAGNOSIS — E1140 Type 2 diabetes mellitus with diabetic neuropathy, unspecified: Secondary | ICD-10-CM | POA: Diagnosis not present

## 2021-08-11 DIAGNOSIS — E1140 Type 2 diabetes mellitus with diabetic neuropathy, unspecified: Secondary | ICD-10-CM | POA: Diagnosis not present

## 2021-08-11 DIAGNOSIS — Z125 Encounter for screening for malignant neoplasm of prostate: Secondary | ICD-10-CM | POA: Diagnosis not present

## 2021-08-11 DIAGNOSIS — Z0000 Encounter for general adult medical examination without abnormal findings: Secondary | ICD-10-CM | POA: Diagnosis not present

## 2021-08-11 DIAGNOSIS — Z9884 Bariatric surgery status: Secondary | ICD-10-CM | POA: Diagnosis not present

## 2021-08-26 DIAGNOSIS — M25511 Pain in right shoulder: Secondary | ICD-10-CM | POA: Diagnosis not present

## 2021-08-26 DIAGNOSIS — G8929 Other chronic pain: Secondary | ICD-10-CM | POA: Diagnosis not present

## 2021-10-07 DIAGNOSIS — M25511 Pain in right shoulder: Secondary | ICD-10-CM | POA: Diagnosis not present

## 2021-10-07 DIAGNOSIS — G8929 Other chronic pain: Secondary | ICD-10-CM | POA: Diagnosis not present

## 2021-10-07 DIAGNOSIS — M7531 Calcific tendinitis of right shoulder: Secondary | ICD-10-CM | POA: Diagnosis not present

## 2021-10-13 DIAGNOSIS — M19011 Primary osteoarthritis, right shoulder: Secondary | ICD-10-CM | POA: Diagnosis not present

## 2021-10-13 DIAGNOSIS — M65811 Other synovitis and tenosynovitis, right shoulder: Secondary | ICD-10-CM | POA: Diagnosis not present

## 2021-10-13 DIAGNOSIS — M7531 Calcific tendinitis of right shoulder: Secondary | ICD-10-CM | POA: Diagnosis not present

## 2021-10-13 DIAGNOSIS — M75111 Incomplete rotator cuff tear or rupture of right shoulder, not specified as traumatic: Secondary | ICD-10-CM | POA: Diagnosis not present

## 2021-10-13 DIAGNOSIS — M25511 Pain in right shoulder: Secondary | ICD-10-CM | POA: Diagnosis not present

## 2021-10-17 DIAGNOSIS — M7531 Calcific tendinitis of right shoulder: Secondary | ICD-10-CM | POA: Diagnosis not present

## 2021-10-17 DIAGNOSIS — G8929 Other chronic pain: Secondary | ICD-10-CM | POA: Diagnosis not present

## 2021-10-17 DIAGNOSIS — M25511 Pain in right shoulder: Secondary | ICD-10-CM | POA: Diagnosis not present
